# Patient Record
Sex: Male | Born: 1980 | Race: White | Hispanic: No | Marital: Married | Smoking: Current every day smoker
Health system: Southern US, Community
[De-identification: ages and names within clinical notes are randomized; demographics above are authoritative.]

## PROBLEM LIST (undated history)

## (undated) DIAGNOSIS — G43909 Migraine, unspecified, not intractable, without status migrainosus: Secondary | ICD-10-CM

## (undated) HISTORY — DX: Migraine, unspecified, not intractable, without status migrainosus: G43.909

---

## 2012-03-22 ENCOUNTER — Emergency Department (HOSPITAL_COMMUNITY): Payer: BC Managed Care – PPO

## 2012-03-22 ENCOUNTER — Emergency Department (HOSPITAL_COMMUNITY)
Admission: EM | Admit: 2012-03-22 | Discharge: 2012-03-22 | Disposition: A | Discharge status: Home / Self Care | Payer: BC Managed Care – PPO | Attending: Emergency Medicine | Admitting: Emergency Medicine

## 2012-03-22 ENCOUNTER — Encounter (HOSPITAL_COMMUNITY): Payer: Self-pay | Admitting: *Deleted

## 2012-03-22 DIAGNOSIS — Z791 Long term (current) use of non-steroidal anti-inflammatories (NSAID): Secondary | ICD-10-CM | POA: Insufficient documentation

## 2012-03-22 DIAGNOSIS — S2249XA Multiple fractures of ribs, unspecified side, initial encounter for closed fracture: Secondary | ICD-10-CM | POA: Insufficient documentation

## 2012-03-22 DIAGNOSIS — Y92410 Unspecified street and highway as the place of occurrence of the external cause: Secondary | ICD-10-CM | POA: Insufficient documentation

## 2012-03-22 DIAGNOSIS — M549 Dorsalgia, unspecified: Secondary | ICD-10-CM | POA: Insufficient documentation

## 2012-03-22 DIAGNOSIS — S2231XA Fracture of one rib, right side, initial encounter for closed fracture: Secondary | ICD-10-CM

## 2012-03-22 DIAGNOSIS — Z87891 Personal history of nicotine dependence: Secondary | ICD-10-CM | POA: Insufficient documentation

## 2012-03-22 DIAGNOSIS — Y9389 Activity, other specified: Secondary | ICD-10-CM | POA: Insufficient documentation

## 2012-03-22 MED ORDER — DIAZEPAM 5 MG PO TABS
5.0000 mg | ORAL_TABLET | Freq: Once | ORAL | Status: AC
  Administered 2012-03-22: 5 mg via ORAL
  Filled 2012-03-22: qty 1

## 2012-03-22 MED ORDER — KETOROLAC TROMETHAMINE 10 MG PO TABS
10.0000 mg | ORAL_TABLET | Freq: Once | ORAL | Status: AC
  Administered 2012-03-22: 10 mg via ORAL
  Filled 2012-03-22: qty 1

## 2012-03-22 MED ORDER — ONDANSETRON HCL 4 MG PO TABS
4.0000 mg | ORAL_TABLET | Freq: Once | ORAL | Status: AC
  Administered 2012-03-22: 4 mg via ORAL
  Filled 2012-03-22: qty 1

## 2012-03-22 MED ORDER — HYDROCODONE-ACETAMINOPHEN 7.5-325 MG PO TABS: 1.0000 | ORAL_TABLET | ORAL | Status: DC | PRN

## 2012-03-22 MED ORDER — HYDROCODONE-ACETAMINOPHEN 5-325 MG PO TABS
2.0000 | ORAL_TABLET | Freq: Once | ORAL | Status: AC
  Administered 2012-03-22: 2 via ORAL
  Filled 2012-03-22: qty 2

## 2012-03-22 MED ORDER — METHOCARBAMOL 500 MG PO TABS: 500.0000 mg | ORAL_TABLET | Freq: Three times a day (TID) | ORAL | Status: DC
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
## 2012-03-22 NOTE — ED Notes (Signed)
Pt reports alternating between Naproxen and Ibuprofen with no pain relief. States the pain has gotten more severe today.
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
## 2012-03-22 NOTE — ED Provider Notes (Signed)
Medical screening examination/treatment/procedure(s) were performed by non-physician practitioner and as supervising physician I was immediately available for consultation/collaboration.  Christopher J. Pollina, MD 03/22/12 2243 

## 2012-10-09 ENCOUNTER — Emergency Department (HOSPITAL_COMMUNITY)
Admission: EM | Admit: 2012-10-09 | Discharge: 2012-10-09 | Disposition: A | Discharge status: Home / Self Care | Payer: BC Managed Care – PPO | Attending: Emergency Medicine | Admitting: Emergency Medicine

## 2012-10-09 ENCOUNTER — Encounter (HOSPITAL_COMMUNITY): Payer: Self-pay | Admitting: *Deleted

## 2012-10-09 DIAGNOSIS — Z23 Encounter for immunization: Secondary | ICD-10-CM | POA: Insufficient documentation

## 2012-10-09 DIAGNOSIS — Y990 Civilian activity done for income or pay: Secondary | ICD-10-CM | POA: Insufficient documentation

## 2012-10-09 DIAGNOSIS — S81812A Laceration without foreign body, left lower leg, initial encounter: Secondary | ICD-10-CM

## 2012-10-09 DIAGNOSIS — S81009A Unspecified open wound, unspecified knee, initial encounter: Secondary | ICD-10-CM | POA: Insufficient documentation

## 2012-10-09 DIAGNOSIS — Y9389 Activity, other specified: Secondary | ICD-10-CM | POA: Insufficient documentation

## 2012-10-09 DIAGNOSIS — F17200 Nicotine dependence, unspecified, uncomplicated: Secondary | ICD-10-CM | POA: Insufficient documentation

## 2012-10-09 DIAGNOSIS — Z79899 Other long term (current) drug therapy: Secondary | ICD-10-CM | POA: Insufficient documentation

## 2012-10-09 DIAGNOSIS — Y929 Unspecified place or not applicable: Secondary | ICD-10-CM | POA: Insufficient documentation

## 2012-10-09 DIAGNOSIS — W268XXA Contact with other sharp object(s), not elsewhere classified, initial encounter: Secondary | ICD-10-CM | POA: Insufficient documentation

## 2012-10-09 MED ORDER — TETANUS-DIPHTH-ACELL PERTUSSIS 5-2.5-18.5 LF-MCG/0.5 IM SUSP
0.5000 mL | Freq: Once | INTRAMUSCULAR | Status: AC
  Administered 2012-10-09: 0.5 mL via INTRAMUSCULAR

## 2012-10-09 MED ORDER — CEPHALEXIN 500 MG PO CAPS: 500.0000 mg | ORAL_CAPSULE | Freq: Three times a day (TID) | ORAL | Status: DC

## 2012-10-09 MED ORDER — BACITRACIN ZINC 500 UNIT/GM EX OINT
TOPICAL_OINTMENT | CUTANEOUS | Status: AC
  Administered 2012-10-09: 19:00:00
  Filled 2012-10-09: qty 0.9

## 2012-10-09 MED ORDER — LIDOCAINE HCL (PF) 1 % IJ SOLN
INTRAMUSCULAR | Status: AC
  Administered 2012-10-09: 19:00:00
  Filled 2012-10-09: qty 10

## 2012-10-09 MED ORDER — TETANUS-DIPHTH-ACELL PERTUSSIS 5-2.5-18.5 LF-MCG/0.5 IM SUSP
INTRAMUSCULAR | Status: AC
  Filled 2012-10-09: qty 0.5

## 2012-10-09 NOTE — ED Provider Notes (Signed)
CSN: [id number]     Arrival date & time 10/09/12  1734 History   First MD Initiated Contact with Patient 10/09/12 1809     Chief Complaint  Patient presents with  . Extremity Laceration   (Consider location/radiation/quality/duration/timing/severity/associated sxs/prior Treatment) Patient is a 32 y.o. male presenting with skin laceration. The history is provided by the patient.  Laceration Location:  Leg Leg laceration location:  L lower leg Length (cm):  7 cm Depth:  Through dermis Quality: jagged   Bleeding: arterial and controlled with pressure   Time since incident:  3 hours Injury mechanism: chainsaw. Pain details:    Quality:  Aching   Severity:  Moderate   Timing:  Constant Foreign body present:  Unable to specify Relieved by:  Pressure Worsened by:  Nothing tried Tetanus status:  Out of date  Guy Carson is a 32 y.o. male who presents to the ED with a laceration to the left lower leg. He was a work and tripped over a chain saw and it landed on his leg and cut it. He bandaged the area with a gauze pressure dressing and has changed it 3 times but continues to bleed.   History reviewed. No pertinent past medical history. History reviewed. No pertinent past surgical history. History reviewed. No pertinent family history. History  Substance Use Topics  . Smoking status: Current Every Day Smoker -- 1.00 packs/day    Types: Cigarettes  . Smokeless tobacco: Not on file  . Alcohol Use: Yes    Review of Systems  Constitutional: Negative for fever and chills.  HENT: Negative for neck pain.   Eyes: Negative for visual disturbance.  Respiratory: Negative for shortness of breath.   Gastrointestinal: Negative for nausea and vomiting.  Skin: Positive for wound.  Neurological: Negative for dizziness and headaches.  Psychiatric/Behavioral: The patient is not nervous/anxious.     Allergies  Review of patient's allergies indicates no known allergies.  Home Medications    Current Outpatient Rx  Name  Route  Sig  Dispense  Refill  . HYDROcodone-acetaminophen (NORCO) 7.5-325 MG per tablet   Oral   Take 1 tablet by mouth every 4 (four) hours as needed for pain.   20 tablet   0   . methocarbamol (ROBAXIN) 500 MG tablet   Oral   Take 1 tablet (500 mg total) by mouth 3 (three) times daily.   21 tablet   0   . naproxen (NAPROSYN) 500 MG tablet   Oral   Take 500 mg by mouth 2 (two) times daily with a meal.          BP 131/78  Pulse 77  Temp(Src) 98.1 F (36.7 C) (Oral)  Resp 18  Ht 5\' 11"  (1.803 m)  Wt 190 lb (86.183 kg)  BMI 26.51 kg/m2  SpO2 100% Physical Exam  Nursing note and vitals reviewed. Constitutional: He is oriented to person, place, and time. He appears well-developed and well-nourished.  HENT:  Head: Normocephalic and atraumatic.  Eyes: Conjunctivae and EOM are normal.  Neck: Normal range of motion. Neck supple.  Cardiovascular: Normal rate.   Pulmonary/Chest: Effort normal.  Musculoskeletal:       Left lower leg: He exhibits tenderness and laceration.       Legs: The length of the wound is 7 cm. It is deep with small particles of debris. There is no tenderness over the bone of the lower leg. Patient is ambulatory without difficulty.  Neurological: He is alert and oriented to person,  place, and time. He has normal strength. No cranial nerve deficit or sensory deficit. Gait normal.  Skin: Skin is warm and dry.  Psychiatric: He has a normal mood and affect. His behavior is normal.    ED Course  Procedures LACERATION REPAIR Performed by: Rula Keniston Authorized by: Jahnavi Muratore Consent: Verbal consent obtained. Risks and benefits: risks, benefits and alternatives were discussed Consent given by: patient Patient identity confirmed: provided demographic data Prepped and Draped in normal sterile fashion Wound explored  Laceration Location: left lower leg  Laceration Length: 7cm  Small pieces of dirt and ? Cloth seen and  removed  Anesthesia: local infiltration  Local anesthetic: lidocaine 1% without epinephrine  Anesthetic total: 6 ml  Scrubbed the wound with betadine and irrigated with large amounts of NSS  Irrigation method: syringe   Skin closure: staples Number of  Staples 13  Patient tolerance: Patient tolerated the procedure well with no immediate complications.  MDM  33 y.o. male with laceration to the left lower leg. Will update tetanus and start antibiotics. He will follow up in 10 days for staple removal or return sooner for any problems.  I have reviewed this patient's vital signs, nurses notes and discussed plan of care with the patient. He voices understanding.    Medication List    TAKE these medications       cephALEXin 500 MG capsule  Commonly known as:  KEFLEX  Take 1 capsule (500 mg total) by mouth 3 (three) times daily.      ASK your doctor about these medications       HYDROcodone-acetaminophen 7.5-325 MG per tablet  Commonly known as:  NORCO  Take 1 tablet by mouth every 4 (four) hours as needed for pain.     methocarbamol 500 MG tablet  Commonly known as:  ROBAXIN  Take 1 tablet (500 mg total) by mouth 3 (three) times daily.     naproxen 500 MG tablet  Commonly known as:  NAPROSYN  Take 500 mg by mouth 2 (two) times daily with a meal.          Janne Napoleon, NP 10/09/12 2011

## 2012-10-09 NOTE — ED Provider Notes (Signed)
Medical screening examination/treatment/procedure(s) were performed by non-physician practitioner and as supervising physician I was immediately available for consultation/collaboration.  Flint Melter, MD 10/09/12 [phone]

## 2012-10-09 NOTE — ED Notes (Signed)
Tripped over chainsaw landing on chain with L leg.  Laceration on medial aspect L lower leg.  Wrapped w/gauze, blood showing through.  Patient states this is third bandage.
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
## 2014-12-22 ENCOUNTER — Ambulatory Visit (HOSPITAL_BASED_OUTPATIENT_CLINIC_OR_DEPARTMENT_OTHER): Payer: Self-pay

## 2014-12-23 ENCOUNTER — Ambulatory Visit (HOSPITAL_BASED_OUTPATIENT_CLINIC_OR_DEPARTMENT_OTHER): Payer: Self-pay

## 2015-04-12 ENCOUNTER — Emergency Department (HOSPITAL_COMMUNITY)
Admission: EM | Admit: 2015-04-12 | Discharge: 2015-04-12 | Disposition: A | Discharge status: Home / Self Care | Payer: PRIVATE HEALTH INSURANCE | Attending: Emergency Medicine | Admitting: Emergency Medicine

## 2015-04-12 ENCOUNTER — Encounter (HOSPITAL_COMMUNITY): Payer: Self-pay | Admitting: Emergency Medicine

## 2015-04-12 DIAGNOSIS — Z791 Long term (current) use of non-steroidal anti-inflammatories (NSAID): Secondary | ICD-10-CM | POA: Insufficient documentation

## 2015-04-12 DIAGNOSIS — Y9389 Activity, other specified: Secondary | ICD-10-CM | POA: Insufficient documentation

## 2015-04-12 DIAGNOSIS — F17210 Nicotine dependence, cigarettes, uncomplicated: Secondary | ICD-10-CM | POA: Diagnosis not present

## 2015-04-12 DIAGNOSIS — T148XXA Other injury of unspecified body region, initial encounter: Secondary | ICD-10-CM

## 2015-04-12 DIAGNOSIS — M70949 Unspecified soft tissue disorder related to use, overuse and pressure, unspecified hand: Secondary | ICD-10-CM | POA: Insufficient documentation

## 2015-04-12 DIAGNOSIS — X503XXA Overexertion from repetitive movements, initial encounter: Secondary | ICD-10-CM

## 2015-04-12 DIAGNOSIS — M7989 Other specified soft tissue disorders: Secondary | ICD-10-CM | POA: Diagnosis present

## 2015-04-12 MED ORDER — IBUPROFEN 400 MG PO TABS
600.0000 mg | ORAL_TABLET | Freq: Once | ORAL | Status: AC
  Administered 2015-04-12: 600 mg via ORAL
  Filled 2015-04-12: qty 2

## 2015-04-12 MED ORDER — NAPROXEN 500 MG PO TABS: 500.0000 mg | ORAL_TABLET | Freq: Two times a day (BID) | ORAL | Status: DC

## 2015-04-12 MED ORDER — DEXAMETHASONE 4 MG PO TABS
12.0000 mg | ORAL_TABLET | Freq: Once | ORAL | Status: AC
  Administered 2015-04-12: 12 mg via ORAL
  Filled 2015-04-12: qty 3

## 2015-04-12 MED ORDER — DEXAMETHASONE 4 MG PO TABS: 4.0000 mg | ORAL_TABLET | Freq: Two times a day (BID) | ORAL | Status: DC
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
## 2020-06-02 ENCOUNTER — Ambulatory Visit (INDEPENDENT_AMBULATORY_CARE_PROVIDER_SITE_OTHER): Payer: PRIVATE HEALTH INSURANCE | Admitting: Internal Medicine

## 2020-06-02 ENCOUNTER — Telehealth (INDEPENDENT_AMBULATORY_CARE_PROVIDER_SITE_OTHER): Payer: Self-pay
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
## 2021-12-14 ENCOUNTER — Other Ambulatory Visit: Payer: Self-pay

## 2021-12-14 ENCOUNTER — Encounter: Payer: Self-pay | Admitting: Emergency Medicine

## 2021-12-14 ENCOUNTER — Ambulatory Visit
Admission: EM | Admit: 2021-12-14 | Discharge: 2021-12-14 | Disposition: A | Discharge status: Home / Self Care | Payer: PRIVATE HEALTH INSURANCE | Attending: Family Medicine | Admitting: Family Medicine

## 2021-12-14 DIAGNOSIS — N50811 Right testicular pain: Secondary | ICD-10-CM | POA: Diagnosis not present

## 2021-12-14 MED ORDER — SULFAMETHOXAZOLE-TRIMETHOPRIM 800-160 MG PO TABS: 1.0000 | ORAL_TABLET | Freq: Two times a day (BID) | ORAL | 0 refills | Status: AC

## 2021-12-14 NOTE — ED Triage Notes (Signed)
Pt reports right testicle swelling since Saturday. 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 Pt denies any difficulty voiding or STD exposure.

## 2021-12-14 NOTE — ED Provider Notes (Signed)
[facility]    CSN: [id number] Arrival date & time: 12/14/21  0813      History   Chief Complaint Chief Complaint  Patient presents with   Groin Swelling    HPI Guy Carson is a 41 y.o. male.   Patient presenting today with right testicular swelling, pain for the past 3 days.  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  Denies dysuria, hematuria, penile discharge, rashes, lesions, fever, chills, abdominal or pelvic pain.  Denies any known STD exposures.  So far trying ibuprofen with minimal relief.    Past Medical History:  Diagnosis Date   Migraines     There are no problems to display for this patient.   History reviewed. No pertinent surgical history.     Home Medications    Prior to Admission medications   Medication Sig Start Date End Date Taking? Authorizing Provider  sulfamethoxazole-trimethoprim (BACTRIM DS) 800-160 MG tablet Take 1 tablet by mouth 2 (two) times daily for 7 days. 12/14/21 12/21/21 Yes Particia Nearing, PA-C  dexamethasone (DECADRON) 4 MG tablet Take 1 tablet (4 mg total) by mouth 2 (two) times daily. 04/12/15   Raeford Razor, MD  ibuprofen (ADVIL,MOTRIN) 200 MG tablet Take 800 mg by mouth every 6 (six) hours as needed for moderate pain.    [provider]  naproxen (NAPROSYN) 500 MG tablet Take 1 tablet (500 mg total) by mouth 2 (two) times daily with a meal. 04/12/15   Raeford Razor, MD    Family History History reviewed. No pertinent family history.  Social History Social History   Tobacco Use   Smoking status: Every Day    Packs/day: 0.50    Types: Cigarettes   Smokeless tobacco: Never  Vaping Use   Vaping Use: Every day  Substance Use Topics   Alcohol use: Yes    Comment: occ   Drug use: No     Allergies   Patient has no known allergies.   Review of Systems Review of Systems Per HPI  Physical Exam Triage Vital Signs ED Triage Vitals  Enc  Vitals Group     BP 12/14/21 0833 131/87     Pulse Rate 12/14/21 0833 89     Resp 12/14/21 0833 20     Temp 12/14/21 0833 98.7 F (37.1 C)     Temp Source 12/14/21 0833 Oral     SpO2 12/14/21 0833 96 %     Weight --      Height --      Head Circumference --      Peak Flow --      Pain Score 12/14/21 0834 7     Pain Loc --      Pain Edu? --      Excl. in GC? --    No data found.  Updated Vital Signs BP 131/87 (BP Location: Right Arm)   Pulse 89   Temp 98.7 F (37.1 C) (Oral)   Resp 20   SpO2 96%   Visual Acuity Right Eye Distance:   Left Eye Distance:   Bilateral Distance:    Right Eye Near:   Left Eye Near:    Bilateral Near:     Physical Exam Vitals and nursing note reviewed. Exam conducted with a chaperone present.  Constitutional:      Appearance: Normal appearance.  HENT:     Head: Atraumatic.  Eyes:     Extraocular  Movements: Extraocular movements intact.     Conjunctiva/sclera: Conjunctivae normal.  Cardiovascular:     Rate and Rhythm: Normal rate and regular rhythm.  Pulmonary:     Effort: Pulmonary effort is normal.     Breath sounds: Normal breath sounds.  Abdominal:     General: Bowel sounds are normal. There is no distension.     Palpations: Abdomen is soft.     Tenderness: There is no abdominal tenderness. There is no guarding.  Genitourinary:    Comments: Right testicle nearly doubled the size of the left, tender to palpation diffusely.  No obvious masses palpable, no redness, lesions, scrotal edema.  Tender to palpation Musculoskeletal:        General: Normal range of motion.     Cervical back: Normal range of motion and neck supple.  Skin:    General: Skin is warm and dry.  Neurological:     General: No focal deficit present.     Mental Status: He is oriented to person, place, and time.  Psychiatric:        Mood and Affect: Mood normal.        Thought Content: Thought content normal.        Judgment: Judgment normal.    UC  Treatments / Results  Labs (all labs ordered are listed, but only abnormal results are displayed) Labs Reviewed - No data to display  EKG   Radiology No results found.  Procedures Procedures (including critical care time)  Medications Ordered in UC Medications - No data to display  Initial Impression / Assessment and Plan / UC Course  I have reviewed the triage vital signs and the nursing notes.  Pertinent labs & imaging results that were available during my care of the patient were reviewed by me and considered in my medical decision making (see chart for details).     Vital signs benign and reassuring today, regarding his right testicular pain, unclear exact etiology.  He declines STI testing today given very low concern for this, unclear if infectious or inflammatory at this point.  Treat with Bactrim, warm Epsom salt baths, ibuprofen and follow-up with urology if not resolving.  Final Clinical Impressions(s) / UC Diagnoses   Final diagnoses:  Testicular pain, right     Discharge Instructions      Try warm Epsom salt baths and ibuprofen to help with your pain.  I have sent over a course of antibiotics to see if this helps resolve your issue.  If you are not feeling better, call the urology office listed in the follow-up section and follow-up with them for further evaluation.    ED Prescriptions     Medication Sig Dispense Auth. Provider   sulfamethoxazole-trimethoprim (BACTRIM DS) 800-160 MG tablet Take 1 tablet by mouth 2 (two) times daily for 7 days. 14 tablet Volney American, [place]      PDMP not reviewed this encounter.   Volney American, [place] 12/14/21 Bosie Helper

## 2021-12-14 NOTE — Discharge Instructions (Signed)
Try warm Epsom salt baths and ibuprofen to help with your pain.  I have sent over a course of antibiotics to see if this helps resolve your issue.  If you are not feeling better, call the urology office listed in the follow-up section and follow-up with them for further evaluation.

## 2021-12-16 ENCOUNTER — Ambulatory Visit (INDEPENDENT_AMBULATORY_CARE_PROVIDER_SITE_OTHER): Payer: PRIVATE HEALTH INSURANCE | Admitting: Urology

## 2021-12-16 VITALS — BP 128/81 | HR 74

## 2021-12-16 DIAGNOSIS — N50811 Right testicular pain: Secondary | ICD-10-CM

## 2021-12-16 DIAGNOSIS — N453 Epididymo-orchitis: Secondary | ICD-10-CM

## 2021-12-16 NOTE — Progress Notes (Signed)
Subjective: 1. Testicular pain, right   2. Orchitis and epididymitis     Sent in consultation by Roosvelt Maser PA  Guy Carson is a 40 yo male who had the onset of right testicular pain and swelling on 12/12 after moving a bed.  He was seen at Urgent care and an antibiotic was sent but he wasn't aware of the script.  He still has some pain but is improving. He has had no prior episodes and no history of a vasectomy.  He has no voiding complaints.  He also has some swelling up into the right groin with some sharp pain.  He has been using Ibuprofen. IPSS is 0.  UA is clear.  ROS:  Review of Systems  Musculoskeletal:  Positive for back pain.  Neurological:  Positive for headaches.    No Known Allergies  Past Medical History:  Diagnosis Date   Migraines     No past surgical history on file.  Social History   Socioeconomic History   Marital status: Married    Spouse name: Not on file   Number of children: Not on file   Years of education: Not on file   Highest education level: Not on file  Occupational History   Not on file  Tobacco Use   Smoking status: Every Day    Packs/day: 0.50    Types: Cigarettes   Smokeless tobacco: Never  Vaping Use   Vaping Use: Every day  Substance and Sexual Activity   Alcohol use: Yes    Comment: occ   Drug use: No   Sexual activity: Not on file  Other Topics Concern   Not on file  Social History Narrative   Not on file   Social Determinants of Health   Financial Resource Strain: Not on file  Food Insecurity: Not on file  Transportation Needs: Not on file  Physical Activity: Not on file  Stress: Not on file  Social Connections: Not on file  Intimate Partner Violence: Not on file    No family history on file.  Anti-infectives: Anti-infectives (From admission, onward)    None       Current Outpatient Medications  Medication Sig Dispense Refill   ibuprofen (ADVIL,MOTRIN) 200 MG tablet Take 800 mg by mouth every 6 (six) hours as  needed for moderate pain.     sulfamethoxazole-trimethoprim (BACTRIM DS) 800-160 MG tablet Take 1 tablet by mouth 2 (two) times daily for 7 days. 14 tablet 0   No current facility-administered medications for this visit.     Objective: Vital signs in last 24 hours: BP 128/81   Pulse 74   Intake/Output from previous day: No intake/output data recorded. Intake/Output this shift: @IOTHISSHIFT @   Physical Exam Vitals reviewed.  Constitutional:      Appearance: Normal appearance.  Abdominal:     General: Abdomen is flat.     Palpations: Abdomen is soft.     Hernia: No hernia is present.     Comments: Mild right inguinal tenderness.   Genitourinary:    Comments: Normal phallus with adequate meatus. Scrotum without erythema or edema. Left testicle and epididymis normal. Right epididymis enlarged and tender with slight testicular tenderness.  Neurological:     Mental Status: He is alert.     Lab Results:  Results for orders placed or performed in visit on 12/16/21 (from the past 24 hour(s))  Urinalysis, Routine w reflex microscopic     Status: None   Collection Time: 12/16/21  1:25 PM  Result Value Ref Range   Specific Gravity, UA 1.020 1.005 - 1.030   pH, UA 7.0 5.0 - 7.5   Color, UA Yellow Yellow   Appearance Ur Clear Clear   Leukocytes,UA Negative Negative   Protein,UA Negative Negative/Trace   Glucose, UA Negative Negative   Ketones, UA Negative Negative   RBC, UA Negative Negative   Bilirubin, UA Negative Negative   Urobilinogen, Ur 0.2 0.2 - 1.0 mg/dL   Nitrite, UA Negative Negative   Microscopic Examination Comment    Narrative   Performed at:  [address] [facility] [address], [place], [place]  [id number] Lab Director: Chinita Pester MT, Phone:  [phone]    BMET No results for input(s): "NA", "K", "CL", "CO2", "GLUCOSE", "BUN", "CREATININE", "CALCIUM" in the last 72 hours. PT/INR No results for input(s): "LABPROT", "INR" in the last 72  hours. ABG No results for input(s): "PHART", "HCO3" in the last 72 hours.  Invalid input(s): "PCO2", "PO2" UA reviewed.  Studies/Results: No results found.   Assessment/Plan: Right epididymo-orchitis.  He is improving on Ibuprofen but I recommended he go ahead and get the bactrim and get started on that.  I discussed using ice and reducing activity and will have him return in 3 weeks.     No orders of the defined types were placed in this encounter.    Orders Placed This Encounter  Procedures   Urinalysis, Routine w reflex microscopic     Return in about 3 weeks (around 01/06/2022).    CC: Roosvelt Maser PA.     Guy Carson 12/17/2021

## 2021-12-17 ENCOUNTER — Encounter: Payer: Self-pay | Admitting: Urology

## 2021-12-17 LAB — URINALYSIS, ROUTINE W REFLEX MICROSCOPIC
Bilirubin, UA: NEGATIVE
Glucose, UA: NEGATIVE
Ketones, UA: NEGATIVE
Leukocytes,UA: NEGATIVE
Nitrite, UA: NEGATIVE
Protein,UA: NEGATIVE
RBC, UA: NEGATIVE
Specific Gravity, UA: 1.02 (ref 1.005–1.030)
Urobilinogen, Ur: 0.2 mg/dL (ref 0.2–1.0)
pH, UA: 7 (ref 5.0–7.5)

## 2022-01-13 ENCOUNTER — Ambulatory Visit (INDEPENDENT_AMBULATORY_CARE_PROVIDER_SITE_OTHER): Payer: PRIVATE HEALTH INSURANCE | Admitting: Urology

## 2022-01-13 VITALS — BP 123/81 | HR 71

## 2022-01-13 DIAGNOSIS — Z87438 Personal history of other diseases of male genital organs: Secondary | ICD-10-CM | POA: Diagnosis not present

## 2022-01-13 DIAGNOSIS — R1031 Right lower quadrant pain: Secondary | ICD-10-CM

## 2022-01-13 DIAGNOSIS — R3129 Other microscopic hematuria: Secondary | ICD-10-CM

## 2022-01-13 NOTE — Progress Notes (Signed)
Subjective: 1. Right inguinal pain   2. History of epididymitis   3. Microhematuria     01/13/22: Guy Carson returns today in f/u.  He has had improvement in the right testicular pain and swelling.  He has some right groin pain that began yesterday.  That pain is tolerable.  He has no voiding complaints.  His UA has 2+ blood on the dip but 0-2 RBC's.   He has no prior history of hematuria or stones.   Guy Carson is a 42 yo male who had the onset of right testicular pain and swelling on 12/12 after moving a bed.  He was seen at Urgent care and an antibiotic was sent but he wasn't aware of the script.  He still has some pain but is improving. He has had no prior episodes and no history of a vasectomy.  He has no voiding complaints.  He also has some swelling up into the right groin with some sharp pain.  He has been using Ibuprofen. IPSS is 0.  UA is clear.  ROS:  Review of Systems  Musculoskeletal:  Positive for back pain.  Neurological:  Positive for headaches.    No Known Allergies  Past Medical History:  Diagnosis Date   Migraines     No past surgical history on file.  Social History   Socioeconomic History   Marital status: Married    Spouse name: Not on file   Number of children: Not on file   Years of education: Not on file   Highest education level: Not on file  Occupational History   Not on file  Tobacco Use   Smoking status: Every Day    Packs/day: 0.50    Types: Cigarettes   Smokeless tobacco: Never  Vaping Use   Vaping Use: Every day  Substance and Sexual Activity   Alcohol use: Yes    Comment: occ   Drug use: No   Sexual activity: Not on file  Other Topics Concern   Not on file  Social History Narrative   Not on file   Social Determinants of Health   Financial Resource Strain: Not on file  Food Insecurity: Not on file  Transportation Needs: Not on file  Physical Activity: Not on file  Stress: Not on file  Social Connections: Not on file  Intimate Partner  Violence: Not on file    No family history on file.  Anti-infectives: Anti-infectives (From admission, onward)    None       Current Outpatient Medications  Medication Sig Dispense Refill   ibuprofen (ADVIL,MOTRIN) 200 MG tablet Take 800 mg by mouth every 6 (six) hours as needed for moderate pain.     No current facility-administered medications for this visit.     Objective: Vital signs in last 24 hours: BP 123/81   Pulse 71   Intake/Output from previous day: No intake/output data recorded. Intake/Output this shift: @IOTHISSHIFT @   Physical Exam Vitals reviewed.  Constitutional:      Appearance: Normal appearance.  Abdominal:     General: Abdomen is flat.     Palpations: Abdomen is soft.     Tenderness: There is abdominal tenderness (mild right inguinal floor).     Hernia: No hernia is present.     Comments: Mild right inguinal tenderness.   Genitourinary:    Comments: Normal phallus with adequate meatus. Scrotum without erythema or edema. Left testicle and epididymis normal. Right testicle and epididymis normal. Neurological:     Mental Status: He  is alert.     Lab Results:  Results for orders placed or performed in visit on 01/13/22 (from the past 24 hour(s))  Urinalysis, Routine w reflex microscopic     Status: Abnormal   Collection Time: 01/13/22  3:32 PM  Result Value Ref Range   Specific Gravity, UA 1.025 1.005 - 1.030   pH, UA 6.0 5.0 - 7.5   Color, UA Yellow Yellow   Appearance Ur Clear Clear   Leukocytes,UA Negative Negative   Protein,UA 2+ (A) Negative/Trace   Glucose, UA Negative Negative   Ketones, UA Negative Negative   RBC, UA 2+ (A) Negative   Bilirubin, UA Negative Negative   Urobilinogen, Ur 1.0 0.2 - 1.0 mg/dL   Nitrite, UA Negative Negative   Microscopic Examination See below:    Narrative   Performed at:  [facility] [address] [place], [place]  [id number] Lab Director: Mina Marble [place], Phone:   [phone]  Microscopic Examination     Status: Abnormal   Collection Time: 01/13/22  3:32 PM   Urine  Result Value Ref Range   WBC, UA 0-5 0 - 5 /hpf   RBC, Urine 0-2 0 - 2 /hpf   Epithelial Cells (non renal) 0-10 0 - 10 /hpf   Mucus, UA Present (A) Not Estab.   Bacteria, UA None seen None seen/Few   Narrative   Performed at:  [facility] [address] [place], [place]  [id number] Lab Director: [place], Phone:  [phone]     BMET No results for input(s): "NA", "K", "CL", "CO2", "GLUCOSE", "BUN", "CREATININE", "CALCIUM" in the last 72 hours. PT/INR No results for input(s): "LABPROT", "INR" in the last 72 hours. ABG No results for input(s): "PHART", "HCO3" in the last 72 hours.  Invalid input(s): "PCO2", "PO2" UA reviewed.  Studies/Results: No results found.   Assessment/Plan: Right epididymo-orchitis.  This appears to have resolved but he is having some mild right inguinal tenderness.  I have recommended ibuprofen.    Microhematuria.  He had 2+ blood on the dip UA but only 0-2 RBC's.   I will repeat a UA at f/u in 6 weeks.   No orders of the defined types were placed in this encounter.    Orders Placed This Encounter  Procedures   Microscopic Examination   Urinalysis, Routine w reflex microscopic     Return in about 6 weeks (around 02/24/2022).    CC: Merrie Roof PA.     Irine Seal 01/14/2022

## 2022-01-14 LAB — URINALYSIS, ROUTINE W REFLEX MICROSCOPIC
Bilirubin, UA: NEGATIVE
Glucose, UA: NEGATIVE
Ketones, UA: NEGATIVE
Leukocytes,UA: NEGATIVE
Nitrite, UA: NEGATIVE
Specific Gravity, UA: 1.025 (ref 1.005–1.030)
Urobilinogen, Ur: 1 mg/dL (ref 0.2–1.0)
pH, UA: 6 (ref 5.0–7.5)

## 2022-01-14 LAB — MICROSCOPIC EXAMINATION: Bacteria, UA: NONE SEEN

## 2022-02-24 ENCOUNTER — Ambulatory Visit: Payer: PRIVATE HEALTH INSURANCE | Admitting: Urology
